# Patient Record
Sex: Female | Born: 1995 | Race: White | Hispanic: No | Marital: Married | Smoking: Never smoker
Health system: Southern US, Community
[De-identification: ages and names within clinical notes are randomized; demographics above are authoritative.]

## PROBLEM LIST (undated history)

## (undated) DIAGNOSIS — T8859XA Other complications of anesthesia, initial encounter: Secondary | ICD-10-CM

## (undated) DIAGNOSIS — J45909 Unspecified asthma, uncomplicated: Secondary | ICD-10-CM

---
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
## 2017-01-01 ENCOUNTER — Other Ambulatory Visit: Payer: Self-pay | Admitting: Specialist

## 2017-01-01 DIAGNOSIS — M5431 Sciatica, right side: Secondary | ICD-10-CM

## 2017-01-15 ENCOUNTER — Encounter: Payer: Self-pay | Admitting: Radiology

## 2017-01-15 ENCOUNTER — Ambulatory Visit
Admission: RE | Admit: 2017-01-15 | Discharge: 2017-01-15 | Disposition: A | Discharge status: Home / Self Care | Payer: 59 | Source: Ambulatory Visit | Attending: Specialist | Admitting: Specialist

## 2017-01-15 DIAGNOSIS — M5431 Sciatica, right side: Secondary | ICD-10-CM

## 2017-01-15 MED ORDER — IOPAMIDOL (ISOVUE-M 200) INJECTION 41%
1.0000 mL | Freq: Once | INTRAMUSCULAR | Status: AC
  Administered 2017-01-15: 1 mL via EPIDURAL

## 2017-01-15 MED ORDER — METHYLPREDNISOLONE ACETATE 40 MG/ML INJ SUSP (RADIOLOG
120.0000 mg | Freq: Once | INTRAMUSCULAR | Status: AC
  Administered 2017-01-15: 120 mg via EPIDURAL

## 2017-01-15 NOTE — Discharge Instructions (Signed)

## 2019-07-28 ENCOUNTER — Encounter (HOSPITAL_BASED_OUTPATIENT_CLINIC_OR_DEPARTMENT_OTHER): Payer: Self-pay | Admitting: *Deleted

## 2019-07-28 ENCOUNTER — Other Ambulatory Visit: Payer: Self-pay

## 2019-07-28 ENCOUNTER — Emergency Department (HOSPITAL_BASED_OUTPATIENT_CLINIC_OR_DEPARTMENT_OTHER): Payer: 59

## 2019-07-28 ENCOUNTER — Emergency Department (HOSPITAL_BASED_OUTPATIENT_CLINIC_OR_DEPARTMENT_OTHER)
Admission: EM | Admit: 2019-07-28 | Discharge: 2019-07-28 | Disposition: A | Discharge status: Home / Self Care | Payer: 59 | Attending: Emergency Medicine | Admitting: Emergency Medicine

## 2019-07-28 DIAGNOSIS — M62838 Other muscle spasm: Secondary | ICD-10-CM | POA: Insufficient documentation

## 2019-07-28 DIAGNOSIS — R0789 Other chest pain: Secondary | ICD-10-CM | POA: Insufficient documentation

## 2019-07-28 DIAGNOSIS — Z79899 Other long term (current) drug therapy: Secondary | ICD-10-CM | POA: Diagnosis not present

## 2019-07-28 DIAGNOSIS — R200 Anesthesia of skin: Secondary | ICD-10-CM | POA: Insufficient documentation

## 2019-07-28 DIAGNOSIS — R079 Chest pain, unspecified: Secondary | ICD-10-CM

## 2019-07-28 LAB — CBC
HCT: 45.3 % (ref 36.0–46.0)
Hemoglobin: 14.9 g/dL (ref 12.0–15.0)
MCH: 30.7 pg (ref 26.0–34.0)
MCHC: 32.9 g/dL (ref 30.0–36.0)
MCV: 93.2 fL (ref 80.0–100.0)
Platelets: 286 10*3/uL (ref 150–400)
RBC: 4.86 MIL/uL (ref 3.87–5.11)
RDW: 11.9 % (ref 11.5–15.5)
WBC: 7.2 10*3/uL (ref 4.0–10.5)
nRBC: 0 % (ref 0.0–0.2)

## 2019-07-28 LAB — BASIC METABOLIC PANEL
Anion gap: 11 (ref 5–15)
BUN: 13 mg/dL (ref 6–20)
CO2: 23 mmol/L (ref 22–32)
Calcium: 9.3 mg/dL (ref 8.9–10.3)
Chloride: 105 mmol/L (ref 98–111)
Creatinine, Ser: 0.75 mg/dL (ref 0.44–1.00)
GFR calc Af Amer: >60 mL/min (ref >60)
GFR calc non Af Amer: >60 mL/min (ref >60)
Glucose, Bld: 113 mg/dL — ABNORMAL HIGH (ref 70–99)
Potassium: 4 mmol/L (ref 3.5–5.1)
Sodium: 139 mmol/L (ref 135–145)

## 2019-07-28 LAB — TROPONIN I (HIGH SENSITIVITY)
Troponin I (High Sensitivity): <2 ng/L (ref <18)
Troponin I (High Sensitivity): <2 ng/L (ref <18)

## 2019-07-28 LAB — D-DIMER, QUANTITATIVE: D-Dimer, Quant: <0.27 ug/mL-FEU (ref 0.00–0.50)

## 2019-07-28 LAB — PREGNANCY, URINE: Preg Test, Ur: NEGATIVE

## 2019-07-28 LAB — TSH: TSH: 3.146 u[IU]/mL (ref 0.350–4.500)

## 2019-07-28 MED ORDER — METHOCARBAMOL 500 MG PO TABS: 500.0000 mg | ORAL_TABLET | Freq: Four times a day (QID) | ORAL | 0 refills | Status: DC

## 2019-07-28 NOTE — ED Triage Notes (Signed)
This am she had a sharp pain in the left side of her neck followed by tightness in her chest. the left side of her body feels weak and tingling. She drove herself here and is ambulatory. Hx of herniated disc 3 years ago.

## 2019-07-28 NOTE — Discharge Instructions (Addendum)
Follow up with your Physician for recheck.  Your thyroid test is pending.  Tylenol for pain Robaxin for possible spasm

## 2019-07-29 NOTE — ED Provider Notes (Signed)
[facility] EMERGENCY DEPARTMENT Provider Note   CSN: [id number] Arrival date & time: 07/28/19  1350     History Chief Complaint  Patient presents with  . Chest Pain  . Numbness    Sharon Lucas is a 24 y.o. female.  Pt reports she had a pain in the front of her neck that radiated down into her chest.  Pt reports pain in her left chest last for seconds and resolves.  The history is provided by the patient. No language interpreter was used.  Chest Pain Pain location:  L chest Pain quality: aching   Pain radiates to:  Neck Pain severity:  Moderate Onset quality:  Gradual Duration:  1 day Timing:  Constant Progression:  Waxing and waning Chronicity:  New Context: not breathing   Relieved by:  Nothing Worsened by:  Nothing Ineffective treatments:  None tried Associated symptoms: no fever, no nausea and no palpitations   Risk factors: no coronary artery disease and no high cholesterol        History reviewed. No pertinent past medical history.  Patient Active Problem List   Diagnosis Date Noted  . Irritable bowel syndrome 11/16/2013  . Mild intermittent asthma 08/01/2012    History reviewed. No pertinent surgical history.   OB History   No obstetric history on file.     No family history on file.  Social History   Tobacco Use  . Smoking status: Never Smoker  . Smokeless tobacco: Never Used  Substance Use Topics  . Alcohol use: Never  . Drug use: Never    Home Medications Prior to Admission medications   Medication Sig Start Date End Date Taking? Authorizing Provider  Norethin Ace-Eth Estrad-FE 1-20 MG-MCG(24) CAPS Take 1 tablet daily 08/04/16  Yes [provider]  albuterol (PROVENTIL HFA;VENTOLIN HFA) 108 (90 BASE) MCG/ACT inhaler Inhale 2 puffs into the lungs every 6 (six) hours as needed for wheezing or shortness of breath.    [provider]  methocarbamol (ROBAXIN) 500 MG tablet Take 1 tablet (500 mg total) by mouth 4  (four) times daily. 07/28/19   Elson Areas, PA-C    Allergies    Patient has no known allergies.  Review of Systems   Review of Systems  Constitutional: Negative for fever.  Cardiovascular: Positive for chest pain. Negative for palpitations.  Gastrointestinal: Negative for nausea.  All other systems reviewed and are negative.   Physical Exam Updated Vital Signs BP (!) 158/73 (BP Location: Right Arm)   Pulse (!) 105   Temp 98.5 F (36.9 C) (Oral)   Resp 20   Ht 5\' 4"  (1.626 m)   Wt 68 kg   SpO2 98%   BMI 25.75 kg/m   Physical Exam Vitals and nursing note reviewed.  Constitutional:      Appearance: She is well-developed.  HENT:     Head: Normocephalic.  Neck:     Comments: No bulging, good pulses,  Some tenderness sternocleidomastoid Cardiovascular:     Rate and Rhythm: Tachycardia present.     Heart sounds: Normal heart sounds.  Pulmonary:     Effort: Pulmonary effort is normal.     Breath sounds: Normal breath sounds.  Chest:     Chest wall: No deformity.  Abdominal:     General: There is no distension.     Palpations: Abdomen is soft.  Musculoskeletal:        General: Normal range of motion.     Cervical back: Normal range of  motion and neck supple.  Neurological:     Mental Status: She is alert and oriented to person, place, and time.  Psychiatric:        Mood and Affect: Mood normal.     ED Results / Procedures / Treatments   Labs (all labs ordered are listed, but only abnormal results are displayed) Labs Reviewed  BASIC METABOLIC PANEL - Abnormal; Notable for the following components:      Result Value   Glucose, Bld 113 (*)    All other components within normal limits  CBC  PREGNANCY, URINE  D-DIMER, QUANTITATIVE (NOT AT [facility])  TSH  TROPONIN I (HIGH SENSITIVITY)  TROPONIN I (HIGH SENSITIVITY)    EKG None  Radiology DG Chest Portable 1 View  Result Date: 07/28/2019 CLINICAL DATA:  Chest pain EXAM: PORTABLE CHEST 1 VIEW COMPARISON:   None. FINDINGS: The heart size and mediastinal contours are within normal limits. Both lungs are clear. The visualized skeletal structures are unremarkable. IMPRESSION: No active disease. Electronically Signed   By: Davina Poke D.O.   On: 07/28/2019 15:07    Procedures Procedures (including critical care time)  Medications Ordered in ED Medications - No data to display  ED Course  I have reviewed the triage vital signs and the nursing notes.  Pertinent labs & imaging results that were available during my care of the patient were reviewed by me and considered in my medical decision making (see chart for details).    MDM Rules/Calculators/A&P                      MDM:  Labs are reviewed and normal  Negative troponin, normal tsh and ddimer.  ECG is nonacute, chest xray is normal I suspect pain is muscular,  MDM Number of Diagnoses or Management Options Atypical chest pain: new, needed workup Muscle spasms of neck: new, needed workup Nonspecific chest pain: new, needed workup   Amount and/or Complexity of Data Reviewed Clinical lab tests: reviewed Review and summarize past medical records: yes Discuss the patient with other providers: yes Independent visualization of images, tracings, or specimens: yes  Risk of Complications, Morbidity, and/or Mortality Presenting problems: moderate Diagnostic procedures: moderate Management options: moderate   Final Clinical Impression(s) / ED Diagnoses Final diagnoses:  Nonspecific chest pain  Atypical chest pain  Muscle spasms of neck    Rx / DC Orders ED Discharge Orders         Ordered    methocarbamol (ROBAXIN) 500 MG tablet  4 times daily,   Status:  Discontinued     07/28/19 1858    methocarbamol (ROBAXIN) 500 MG tablet  4 times daily     07/28/19 1900        An After Visit Summary was printed and given to the patient.    Fransico Meadow, Hershal Coria 07/29/19 0908    Truddie Hidden, MD 07/30/19 [phone]

## 2020-04-13 HISTORY — PX: ANTERIOR CRUCIATE LIGAMENT REPAIR: SHX115

## 2020-11-04 DIAGNOSIS — S83512A Sprain of anterior cruciate ligament of left knee, initial encounter: Secondary | ICD-10-CM | POA: Diagnosis not present

## 2020-11-04 DIAGNOSIS — M2392 Unspecified internal derangement of left knee: Secondary | ICD-10-CM | POA: Diagnosis not present

## 2020-11-04 DIAGNOSIS — S83242A Other tear of medial meniscus, current injury, left knee, initial encounter: Secondary | ICD-10-CM | POA: Diagnosis not present

## 2020-11-04 DIAGNOSIS — S8992XA Unspecified injury of left lower leg, initial encounter: Secondary | ICD-10-CM | POA: Diagnosis not present

## 2020-11-05 DIAGNOSIS — M25562 Pain in left knee: Secondary | ICD-10-CM | POA: Diagnosis not present

## 2020-11-11 DIAGNOSIS — M25562 Pain in left knee: Secondary | ICD-10-CM | POA: Diagnosis not present

## 2020-12-19 DIAGNOSIS — S83502A Sprain of unspecified cruciate ligament of left knee, initial encounter: Secondary | ICD-10-CM | POA: Diagnosis not present

## 2020-12-26 ENCOUNTER — Other Ambulatory Visit: Payer: Self-pay | Admitting: Orthopedic Surgery

## 2020-12-26 ENCOUNTER — Ambulatory Visit
Admission: RE | Admit: 2020-12-26 | Discharge: 2020-12-26 | Disposition: A | Discharge status: Home / Self Care | Payer: 59 | Source: Ambulatory Visit | Attending: Orthopedic Surgery | Admitting: Orthopedic Surgery

## 2020-12-26 ENCOUNTER — Other Ambulatory Visit: Payer: Self-pay

## 2020-12-26 ENCOUNTER — Ambulatory Visit
Admission: RE | Admit: 2020-12-26 | Discharge: 2020-12-26 | Disposition: A | Discharge status: Home / Self Care | Payer: Worker's Compensation | Source: Ambulatory Visit | Attending: Orthopedic Surgery | Admitting: Orthopedic Surgery

## 2020-12-26 DIAGNOSIS — K760 Fatty (change of) liver, not elsewhere classified: Secondary | ICD-10-CM | POA: Diagnosis not present

## 2020-12-26 DIAGNOSIS — R0602 Shortness of breath: Secondary | ICD-10-CM

## 2020-12-26 DIAGNOSIS — M7989 Other specified soft tissue disorders: Secondary | ICD-10-CM

## 2020-12-26 DIAGNOSIS — R079 Chest pain, unspecified: Secondary | ICD-10-CM

## 2020-12-26 DIAGNOSIS — M79605 Pain in left leg: Secondary | ICD-10-CM | POA: Diagnosis not present

## 2020-12-26 DIAGNOSIS — M79662 Pain in left lower leg: Secondary | ICD-10-CM

## 2020-12-26 MED ORDER — IOPAMIDOL (ISOVUE-370) INJECTION 76%
75.0000 mL | Freq: Once | INTRAVENOUS | Status: AC | PRN
  Administered 2020-12-26: 75 mL via INTRAVENOUS

## 2020-12-30 DIAGNOSIS — Z0000 Encounter for general adult medical examination without abnormal findings: Secondary | ICD-10-CM | POA: Diagnosis not present

## 2020-12-30 DIAGNOSIS — Z13220 Encounter for screening for lipoid disorders: Secondary | ICD-10-CM | POA: Diagnosis not present

## 2020-12-30 DIAGNOSIS — Z23 Encounter for immunization: Secondary | ICD-10-CM | POA: Diagnosis not present

## 2020-12-30 DIAGNOSIS — R42 Dizziness and giddiness: Secondary | ICD-10-CM | POA: Diagnosis not present

## 2020-12-30 DIAGNOSIS — L659 Nonscarring hair loss, unspecified: Secondary | ICD-10-CM | POA: Diagnosis not present

## 2021-02-25 DIAGNOSIS — H5213 Myopia, bilateral: Secondary | ICD-10-CM | POA: Diagnosis not present

## 2021-05-08 ENCOUNTER — Encounter (HOSPITAL_BASED_OUTPATIENT_CLINIC_OR_DEPARTMENT_OTHER): Payer: Self-pay | Admitting: Orthopedic Surgery

## 2021-05-08 ENCOUNTER — Other Ambulatory Visit: Payer: Self-pay

## 2021-05-14 NOTE — H&P (Addendum)
PREOPERATIVE H&P  Chief Complaint: painful left knee popping and catching  HPI: Sharon Lucas is a 26 y.o. female who presents for preoperative history and physical evaluation for a left knee s/p ACL hamstring autograft reconstruction and partial lateral menisectomy with persistent painful popping and catching in the knee. She has been in physical therapy, but it has been difficult to do rehab secondary to this painful clunk in her knee. She underwent a [employer] evaluation that showed very poor results because of this pain. She has tried intraarticular injection which did not help her pain. She has also been on Celebrex. 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 This is significantly impairing activities of daily living.  She has elected for surgical management.   Past Medical History:  Diagnosis Date   Complication of anesthesia    post ACL surgery had SOB, CP, dizzienee, nausea, vertigo--was negative for DVT   Past Surgical History:  Procedure Laterality Date   ANTERIOR CRUCIATE LIGAMENT REPAIR Left 2022   surgical center [place]   Social History   Socioeconomic History   Marital status: Single    Spouse name: Not on file   Number of children: Not on file   Years of education: Not on file   Highest education level: Not on file  Occupational History   Not on file  Tobacco Use   Smoking status: Never   Smokeless tobacco: Never  Vaping Use   Vaping Use: Never used  Substance and Sexual Activity   Alcohol use: Never   Drug use: Never   Sexual activity: Not on file  Other Topics Concern   Not on file  Social History Narrative   Not on file   Social Determinants of Health   Financial Resource Strain: Not on file  Food Insecurity: Not on file  Transportation Needs: Not on file  Physical Activity: Not on file  Stress: Not on file  Social Connections: Not on file   No family history on file. No Known Allergies Prior to  Admission medications   Medication Sig Start Date End Date Taking? Authorizing Provider  Multiple Vitamin (MULTIVITAMIN ADULT PO) Take by mouth.   Yes [provider]  Norethin Ace-Eth Estrad-FE 1-20 MG-MCG(24) CAPS Take 1 tablet daily 08/04/16  Yes [provider]  psyllium (METAMUCIL) 58.6 % powder Take 1 packet by mouth 3 (three) times daily.   Yes [provider]  albuterol (PROVENTIL HFA;VENTOLIN HFA) 108 (90 BASE) MCG/ACT inhaler Inhale 2 puffs into the lungs every 6 (six) hours as needed for wheezing or shortness of breath.    [provider]  methocarbamol (ROBAXIN) 500 MG tablet Take 1 tablet (500 mg total) by mouth 4 (four) times daily. 07/28/19   Elson Areas, PA-C     Positive ROS: All other systems have been reviewed and were otherwise negative with the exception of those mentioned in the HPI and as above.  Physical Exam: General: Alert, no acute distress Cardiovascular: No pedal edema Respiratory: No cyanosis, no use of accessory musculature GI: No organomegaly, abdomen is soft and non-tender Skin: No lesions in the area of chief complaint Neurologic: Sensation intact distally Psychiatric: Patient is competent for consent with normal mood and affect Lymphatic: No axillary or cervical lymphadenopathy  MUSCULOSKELETAL: On examination of the left knee reveals popping through a 40-60 degree range of motion with a painful clunk and pop. The range of motion is from 0-120 degrees. The knee is stable.  Incisions are healing well.   Assessment: Post operative left knee arthrofibrosis and synovitis   Plan: Plan for Procedure(s): ARTHROSCOPY KNEE LYSIS OF ADHESION EXAM UNDER ANESTHESIA WITH MANIPULATION OF KNEE  The risks benefits and alternatives were discussed with the patient including but not limited to the risks of nonoperative treatment, versus surgical intervention including infection, bleeding, nerve injury,  blood clots,  cardiopulmonary complications, morbidity, mortality, among others, and they were willing to proceed.   Armida Sans, PA-C    05/14/2021 4:13 PM

## 2021-05-16 NOTE — Progress Notes (Signed)

## 2021-05-18 NOTE — Anesthesia Preprocedure Evaluation (Addendum)
Anesthesia Evaluation  Patient identified by MRN, date of birth, ID band Patient awake    Reviewed: Allergy & Precautions, NPO status , Patient's Chart, lab work & pertinent test results  History of Anesthesia Complications Negative for: history of anesthetic complications  Airway Mallampati: I  TM Distance: >3 FB Neck ROM: Full    Dental no notable dental hx. (+) Dental Advisory Given   Pulmonary asthma ,    Pulmonary exam normal        Cardiovascular negative cardio ROS Normal cardiovascular exam     Neuro/Psych negative neurological ROS     GI/Hepatic negative GI ROS, Neg liver ROS,   Endo/Other  negative endocrine ROS  Renal/GU negative Renal ROS     Musculoskeletal negative musculoskeletal ROS (+)   Abdominal   Peds  Hematology negative hematology ROS (+)   Anesthesia Other Findings   Reproductive/Obstetrics                            Anesthesia Physical Anesthesia Plan  ASA: 2  Anesthesia Plan: General   Post-op Pain Management: Tylenol PO (pre-op) and Celebrex PO (pre-op)   Induction:   PONV Risk Score and Plan: 3 and Ondansetron, Dexamethasone, Midazolam and Scopolamine patch - Pre-op  Airway Management Planned: LMA  Additional Equipment:   Intra-op Plan:   Post-operative Plan: Extubation in OR  Informed Consent: I have reviewed the patients History and Physical, chart, labs and discussed the procedure including the risks, benefits and alternatives for the proposed anesthesia with the patient or authorized representative who has indicated his/her understanding and acceptance.     Dental advisory given  Plan Discussed with: Anesthesiologist and CRNA  Anesthesia Plan Comments:        Anesthesia Quick Evaluation

## 2021-05-19 ENCOUNTER — Ambulatory Visit (HOSPITAL_BASED_OUTPATIENT_CLINIC_OR_DEPARTMENT_OTHER): Payer: No Typology Code available for payment source | Admitting: Certified Registered"

## 2021-05-19 ENCOUNTER — Encounter (HOSPITAL_BASED_OUTPATIENT_CLINIC_OR_DEPARTMENT_OTHER): Payer: Self-pay | Admitting: Orthopedic Surgery

## 2021-05-19 ENCOUNTER — Encounter (HOSPITAL_BASED_OUTPATIENT_CLINIC_OR_DEPARTMENT_OTHER)
Admission: RE | Disposition: A | Discharge status: Home / Self Care | Payer: Self-pay | Source: Ambulatory Visit | Attending: Orthopedic Surgery

## 2021-05-19 ENCOUNTER — Ambulatory Visit (HOSPITAL_BASED_OUTPATIENT_CLINIC_OR_DEPARTMENT_OTHER)
Admission: RE | Admit: 2021-05-19 | Discharge: 2021-05-19 | Disposition: A | Discharge status: Home / Self Care | Payer: No Typology Code available for payment source | Source: Ambulatory Visit | Attending: Orthopedic Surgery | Admitting: Orthopedic Surgery

## 2021-05-19 ENCOUNTER — Other Ambulatory Visit (HOSPITAL_BASED_OUTPATIENT_CLINIC_OR_DEPARTMENT_OTHER): Payer: Self-pay

## 2021-05-19 ENCOUNTER — Other Ambulatory Visit: Payer: Self-pay

## 2021-05-19 DIAGNOSIS — Z9889 Other specified postprocedural states: Secondary | ICD-10-CM | POA: Diagnosis not present

## 2021-05-19 DIAGNOSIS — M238X2 Other internal derangements of left knee: Secondary | ICD-10-CM | POA: Diagnosis not present

## 2021-05-19 DIAGNOSIS — M659 Synovitis and tenosynovitis, unspecified: Secondary | ICD-10-CM | POA: Diagnosis not present

## 2021-05-19 DIAGNOSIS — J45909 Unspecified asthma, uncomplicated: Secondary | ICD-10-CM | POA: Diagnosis not present

## 2021-05-19 DIAGNOSIS — G8918 Other acute postprocedural pain: Secondary | ICD-10-CM | POA: Diagnosis not present

## 2021-05-19 DIAGNOSIS — M24662 Ankylosis, left knee: Secondary | ICD-10-CM | POA: Insufficient documentation

## 2021-05-19 HISTORY — PX: EXAM UNDER ANESTHESIA WITH MANIPULATION OF KNEE: SHX5816

## 2021-05-19 HISTORY — PX: KNEE ARTHROSCOPY: SHX127

## 2021-05-19 HISTORY — DX: Unspecified asthma, uncomplicated: J45.909

## 2021-05-19 HISTORY — PX: LYSIS OF ADHESION: SHX5961

## 2021-05-19 HISTORY — DX: Other complications of anesthesia, initial encounter: T88.59XA

## 2021-05-19 LAB — POCT PREGNANCY, URINE: Preg Test, Ur: NEGATIVE

## 2021-05-19 SURGERY — ARTHROSCOPY, KNEE
Anesthesia: General | Site: Knee | Laterality: Left

## 2021-05-19 MED ORDER — FENTANYL CITRATE (PF) 100 MCG/2ML IJ SOLN
INTRAMUSCULAR | Status: AC
  Filled 2021-05-19: qty 2

## 2021-05-19 MED ORDER — ONDANSETRON HCL 4 MG/2ML IJ SOLN
INTRAMUSCULAR | Status: AC
  Filled 2021-05-19: qty 2

## 2021-05-19 MED ORDER — SCOPOLAMINE 1 MG/3DAYS TD PT72: 1.0000 | MEDICATED_PATCH | TRANSDERMAL | Status: DC

## 2021-05-19 MED ORDER — FENTANYL CITRATE (PF) 100 MCG/2ML IJ SOLN: 25.0000 ug | INTRAMUSCULAR | Status: DC | PRN

## 2021-05-19 MED ORDER — SODIUM CHLORIDE 0.9 % IR SOLN
Status: DC | PRN
  Administered 2021-05-19: 3000 mL

## 2021-05-19 MED ORDER — METHYLPREDNISOLONE ACETATE 80 MG/ML IJ SUSP
INTRAMUSCULAR | Status: AC
  Filled 2021-05-19: qty 1

## 2021-05-19 MED ORDER — CELECOXIB 200 MG PO CAPS
ORAL_CAPSULE | ORAL | Status: AC
  Filled 2021-05-19: qty 1

## 2021-05-19 MED ORDER — AMISULPRIDE (ANTIEMETIC) 5 MG/2ML IV SOLN: 10.0000 mg | Freq: Once | INTRAVENOUS | Status: DC | PRN

## 2021-05-19 MED ORDER — LIDOCAINE HCL (CARDIAC) PF 100 MG/5ML IV SOSY
PREFILLED_SYRINGE | INTRAVENOUS | Status: DC | PRN
  Administered 2021-05-19: 80 mg via INTRAVENOUS

## 2021-05-19 MED ORDER — CEFAZOLIN SODIUM-DEXTROSE 2-4 GM/100ML-% IV SOLN
2.0000 g | INTRAVENOUS | Status: AC
  Administered 2021-05-19: 2 g via INTRAVENOUS

## 2021-05-19 MED ORDER — ACETAMINOPHEN 500 MG PO TABS: 1000.0000 mg | ORAL_TABLET | Freq: Once | ORAL | Status: AC

## 2021-05-19 MED ORDER — ACETAMINOPHEN 500 MG PO TABS
ORAL_TABLET | ORAL | Status: AC
  Filled 2021-05-19: qty 2

## 2021-05-19 MED ORDER — MIDAZOLAM HCL 2 MG/2ML IJ SOLN
INTRAMUSCULAR | Status: AC
  Filled 2021-05-19: qty 2

## 2021-05-19 MED ORDER — CEFAZOLIN SODIUM-DEXTROSE 2-4 GM/100ML-% IV SOLN
INTRAVENOUS | Status: AC
  Filled 2021-05-19: qty 100

## 2021-05-19 MED ORDER — BUPIVACAINE HCL (PF) 0.25 % IJ SOLN
INTRAMUSCULAR | Status: AC
  Filled 2021-05-19: qty 30

## 2021-05-19 MED ORDER — FENTANYL CITRATE (PF) 100 MCG/2ML IJ SOLN
100.0000 ug | Freq: Once | INTRAMUSCULAR | Status: AC
  Administered 2021-05-19: 100 ug via INTRAVENOUS

## 2021-05-19 MED ORDER — FENTANYL CITRATE (PF) 100 MCG/2ML IJ SOLN
INTRAMUSCULAR | Status: DC | PRN
  Administered 2021-05-19: 50 ug via INTRAVENOUS

## 2021-05-19 MED ORDER — LACTATED RINGERS IV SOLN: INTRAVENOUS | Status: DC

## 2021-05-19 MED ORDER — METHYLPREDNISOLONE ACETATE 40 MG/ML IJ SUSP
INTRAMUSCULAR | Status: AC
  Filled 2021-05-19: qty 1

## 2021-05-19 MED ORDER — SCOPOLAMINE 1 MG/3DAYS TD PT72
1.0000 | MEDICATED_PATCH | TRANSDERMAL | Status: DC
  Administered 2021-05-19: 1.5 mg via TRANSDERMAL

## 2021-05-19 MED ORDER — PROMETHAZINE HCL 25 MG/ML IJ SOLN: 6.2500 mg | INTRAMUSCULAR | Status: DC | PRN

## 2021-05-19 MED ORDER — ONDANSETRON HCL 4 MG/2ML IJ SOLN
INTRAMUSCULAR | Status: DC | PRN
Start: 2021-05-19 — End: 2021-05-19
  Administered 2021-05-19: 4 mg via INTRAVENOUS

## 2021-05-19 MED ORDER — PROPOFOL 10 MG/ML IV BOLUS
INTRAVENOUS | Status: DC | PRN
  Administered 2021-05-19: 200 mg via INTRAVENOUS

## 2021-05-19 MED ORDER — BUPIVACAINE-EPINEPHRINE (PF) 0.5% -1:200000 IJ SOLN
INTRAMUSCULAR | Status: DC | PRN
  Administered 2021-05-19: 20 mL

## 2021-05-19 MED ORDER — CELECOXIB 200 MG PO CAPS
200.0000 mg | ORAL_CAPSULE | Freq: Once | ORAL | Status: AC
  Administered 2021-05-19: 200 mg via ORAL

## 2021-05-19 MED ORDER — MIDAZOLAM HCL 2 MG/2ML IJ SOLN
2.0000 mg | Freq: Once | INTRAMUSCULAR | Status: AC
  Administered 2021-05-19: 2 mg via INTRAVENOUS

## 2021-05-19 MED ORDER — ACETAMINOPHEN 500 MG PO TABS
1000.0000 mg | ORAL_TABLET | Freq: Once | ORAL | Status: AC
  Administered 2021-05-19: 1000 mg via ORAL

## 2021-05-19 MED ORDER — HYDROCODONE-ACETAMINOPHEN 5-325 MG PO TABS
1.0000 | ORAL_TABLET | Freq: Four times a day (QID) | ORAL | 0 refills | Status: AC | PRN
  Filled 2021-05-19: qty 10, 3d supply, fill #0

## 2021-05-19 MED ORDER — POVIDONE-IODINE 7.5 % EX SOLN: Freq: Once | CUTANEOUS | Status: DC

## 2021-05-19 MED ORDER — MIDAZOLAM HCL 5 MG/5ML IJ SOLN
INTRAMUSCULAR | Status: DC | PRN
  Administered 2021-05-19: 1 mg via INTRAVENOUS

## 2021-05-19 MED ORDER — SCOPOLAMINE 1 MG/3DAYS TD PT72
MEDICATED_PATCH | TRANSDERMAL | Status: AC
  Filled 2021-05-19: qty 1

## 2021-05-19 MED ORDER — DEXAMETHASONE SODIUM PHOSPHATE 4 MG/ML IJ SOLN
INTRAMUSCULAR | Status: DC | PRN
  Administered 2021-05-19: 10 mg via INTRAVENOUS

## 2021-05-19 SURGICAL SUPPLY — 29 items
BLADE SURG 15 STRL LF DISP TIS (BLADE) IMPLANT
BLADE SURG 15 STRL SS (BLADE) ×2
BNDG ELASTIC 6X5.8 VLCR STR LF (GAUZE/BANDAGES/DRESSINGS) ×2 IMPLANT
DISSECTOR  3.8MM X 13CM (MISCELLANEOUS) ×2
DISSECTOR 3.8MM X 13CM (MISCELLANEOUS) IMPLANT
DRAPE ARTHROSCOPY W/POUCH 90 (DRAPES) ×2 IMPLANT
DURAPREP 26ML APPLICATOR (WOUND CARE) ×2 IMPLANT
EXCALIBUR 3.8MM X 13CM (MISCELLANEOUS) ×1 IMPLANT
GAUZE SPONGE 4X4 12PLY STRL (GAUZE/BANDAGES/DRESSINGS) ×2 IMPLANT
GAUZE XEROFORM 1X8 LF (GAUZE/BANDAGES/DRESSINGS) ×2 IMPLANT
GLOVE SURG MICRO LTX SZ7.5 (GLOVE) ×2 IMPLANT
GLOVE SURG UNDER POLY LF SZ7.5 (GLOVE) ×2 IMPLANT
GOWN STRL REUS W/ TWL LRG LVL3 (GOWN DISPOSABLE) ×2 IMPLANT
GOWN STRL REUS W/ TWL XL LVL3 (GOWN DISPOSABLE) ×1 IMPLANT
GOWN STRL REUS W/TWL LRG LVL3 (GOWN DISPOSABLE) ×2
GOWN STRL REUS W/TWL XL LVL3 (GOWN DISPOSABLE) ×2
HOLDER KNEE FOAM BLUE (MISCELLANEOUS) ×2 IMPLANT
MANIFOLD NEPTUNE II (INSTRUMENTS) ×2 IMPLANT
NDL SAFETY ECLIPSE 18X1.5 (NEEDLE) ×2 IMPLANT
NEEDLE HYPO 18GX1.5 SHARP (NEEDLE) ×4
NEEDLE HYPO 22GX1.5 SAFETY (NEEDLE) ×1 IMPLANT
PACK ARTHROSCOPY DSU (CUSTOM PROCEDURE TRAY) ×2 IMPLANT
PACK BASIN DAY SURGERY FS (CUSTOM PROCEDURE TRAY) ×2 IMPLANT
PORT APPOLLO RF 90DEGREE MULTI (SURGICAL WAND) ×1 IMPLANT
SUT ETHILON 4 0 PS 2 18 (SUTURE) ×2 IMPLANT
SYR 5ML LL (SYRINGE) ×2 IMPLANT
TOWEL GREEN STERILE FF (TOWEL DISPOSABLE) ×2 IMPLANT
TUBING ARTHROSCOPY IRRIG 16FT (MISCELLANEOUS) ×2 IMPLANT
WRAP KNEE MAXI GEL POST OP (GAUZE/BANDAGES/DRESSINGS) ×2 IMPLANT

## 2021-05-19 NOTE — Discharge Instructions (Addendum)
Diet: As you were doing prior to hospitalization   Shower:  May shower but keep the wounds dry, use an occlusive plastic wrap, NO SOAKING IN TUB.  If the bandage gets wet, change with a clean dry gauze.   Dressing:  You may change your dressing 3-5 days after surgery  Activity:  Increase activity slowly as tolerated, but follow the weight bearing instructions below.  The rules on driving is that you can not be taking narcotics while you drive, and you must feel in control of the vehicle.    Weight Bearing:  Weight bearing as tolerated.   To prevent constipation: you may use a stool softener such as -  Colace (over the counter) 100 mg by mouth twice a day  Drink plenty of fluids (prune juice may be helpful) and high fiber foods Miralax (over the counter) for constipation as needed.    Itching:  If you experience itching with your medications, try taking only a single pain pill, or even half a pain pill at a time.  You may take up to 10 pain pills per day, and you can also use benadryl over the counter for itching or also to help with sleep.   Precautions:  If you experience chest pain or shortness of breath - call 911 immediately for transfer to the hospital emergency department!!  If you develop a fever greater that 101 F, purulent drainage from wound, increased redness or drainage from wound, or calf pain -- Call the office at [phone]                                                Follow- Up Appointment:  Please call for an appointment to be seen postoperatively [facility] - [phone]  We will email you post-operative exercise program to work on at home.   *You had 1000 mg of Tylenol at 10:15 am *No ibuprofen/motrin/advil until after 4:20 pm  Post Anesthesia Home Care Instructions  Activity: Get plenty of rest for the remainder of the day. A responsible individual must stay with you for 24 hours following the procedure.  For the next 24 hours, DO NOT: -Drive a car -Social worker -Drink alcoholic beverages -Take any medication unless instructed by your physician -Make any legal decisions or sign important papers.  Meals: Start with liquid foods such as gelatin or soup. Progress to regular foods as tolerated. Avoid greasy, spicy, heavy foods. If nausea and/or vomiting occur, drink only clear liquids until the nausea and/or vomiting subsides. Call your physician if vomiting continues.  Special Instructions/Symptoms: Your throat may feel dry or sore from the anesthesia or the breathing tube placed in your throat during surgery. If this causes discomfort, gargle with warm salt water. The discomfort should disappear within 24 hours.  If you had a scopolamine patch placed behind your ear for the management of post- operative nausea and/or vomiting:  1. The medication in the patch is effective for 72 hours, after which it should be removed.  Wrap patch in a tissue and discard in the trash. Wash hands thoroughly with soap and water. 2. You may remove the patch earlier than 72 hours if you experience unpleasant side effects which may include dry mouth, dizziness or visual disturbances. 3. Avoid touching the patch. Wash your hands with soap and water after contact with the patch.  Regional Anesthesia Blocks  1. Numbness or the inability to move the "blocked" extremity may last from 3-48 hours after placement. The length of time depends on the medication injected and your individual response to the medication. If the numbness is not going away after 48 hours, call your surgeon.  2. The extremity that is blocked will need to be protected until the numbness is gone and the  Strength has returned. Because you cannot feel it, you will need to take extra care to avoid injury. Because it may be weak, you may have difficulty moving it or using it. You may not know what position it is in without looking at it while the block is in effect.  3. For blocks in the legs and  feet, returning to weight bearing and walking needs to be done carefully. You will need to wait until the numbness is entirely gone and the strength has returned. You should be able to move your leg and foot normally before you try and bear weight or walk. You will need someone to be with you when you first try to ensure you do not fall and possibly risk injury.  4. Bruising and tenderness at the needle site are common side effects and will resolve in a few days.  5. Persistent numbness or new problems with movement should be communicated to the surgeon or the [facility] [phone] [facility] [phone]).

## 2021-05-19 NOTE — Progress Notes (Signed)
Assisted Dr. Tobias Alexander with left, ultrasound guided, knee block. Side rails up, monitors on throughout procedure. See vital signs in flow sheet. Tolerated Procedure well.

## 2021-05-19 NOTE — Anesthesia Postprocedure Evaluation (Signed)
Anesthesia Post Note  Patient: Sharon Lucas  Procedure(s) Performed: ARTHROSCOPY KNEE (Left: Knee) LYSIS OF ADHESION (Left: Knee) EXAM UNDER ANESTHESIA WITH MANIPULATION OF KNEE (Left: Knee)     Patient location during evaluation: PACU Anesthesia Type: General Level of consciousness: sedated Pain management: pain level controlled Vital Signs Assessment: post-procedure vital signs reviewed and stable Respiratory status: spontaneous breathing and respiratory function stable Cardiovascular status: stable Postop Assessment: no apparent nausea or vomiting Anesthetic complications: no   No notable events documented.  Last Vitals:  Vitals:   05/19/21 1330 05/19/21 1356  BP: 114/76 (!) 137/91  Pulse: (!) 121 (!) 119  Resp: 20 18  Temp:  36.7 C  SpO2: 96% 97%    Last Pain:  Vitals:   05/19/21 1342  TempSrc:   PainSc: 0-No pain                 Daimion Adamcik DANIEL

## 2021-05-19 NOTE — Op Note (Signed)
[name], SHATZ MEDICAL RECORD NO: [id number] ACCOUNT NO: [id number] DATE OF BIRTH: 05-23-1995 FACILITY: [facility] LOCATION: [facility] PHYSICIAN: Elana Alm. Thurston Hole, MD  Operative Report   DATE OF PROCEDURE: 05/19/2021  PREOPERATIVE DIAGNOSIS:  Left knee adhesions/synovitis, status post ACL reconstruction.  POSTOPERATIVE DIAGNOSIS:  Left knee adhesions/synovitis, status post ACL reconstruction.  PROCEDURE:  Left knee EUA followed by arthroscopic lysis of adhesions and synovectomy.  SURGEON:  Elana Alm. Thurston Hole, MD  ANESTHESIA:  General.  OPERATIVE TIME:  30 minutes.  COMPLICATIONS:  None.  INDICATIONS FOR PROCEDURE:  The patient is a 26 year old who had undergone left knee ACL reconstruction 5 months ago.  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  DESCRIPTION OF PROCEDURE:  The patient was brought to the operating room on 05/19/2021 after a knee block had been placed in the holding room by Anesthesia.  She was placed on the operating table in a supine position.  After being placed under general  anesthesia, her left knee was examined.  She had full range of motion, knee with stable ligamentous exam with normal patellar tracking.  Left leg was prepped in usual sterile DuraPrep and draped using sterile technique.  Timeout procedure was called and  the correct left knee identified. Initially through an anterolateral portal, the arthroscope with a pump attached was placed and through an anteromedial portal, an arthroscopic probe was placed.  On initial inspection of the medial compartment, the  articular cartilage was intact.  Medial meniscus was intact.  There was thickened medial bands of synovitis and adhesions, which were thoroughly debrided.  Intercondylar  notch was evaluated.  There was a significant cyclops lesion anterior to the ACL  graft, which was thoroughly debrided and removed.  The ACL graft was intact and stable.  PCL was intact and stable.  Lateral compartment articular cartilage was normal.  Lateral meniscus was normal.  There were more thick adhesions and synovitis in the  anterolateral aspect of the knee that were thoroughly debrided and cauterized as well.  The patellofemoral joint was inspected.  The articular cartilage was normal.  The patella tracked normally, but there were more thick bands of adhesions and synovitis  in the anteromedial and anterolateral gutters and these were thoroughly debrided and cauterized as well.  At this point, I felt that all pathology had been satisfactorily addressed.  The instruments were removed.  Portals were closed with 3-0 nylon  suture.  Sterile dressings were applied and the patient was awakened and taken to recovery room in stable condition.  FOLLOWUP CARE:  The patient will be followed as an outpatient on Norco for pain with physical therapy.  See me back in office in a week for sutures out and follow up.   VAI D: 05/19/2021 1:37:48 pm T: 05/19/2021 11:05:00 pm  JOB: [id number]/ [id number]

## 2021-05-19 NOTE — Anesthesia Procedure Notes (Signed)
Anesthesia Regional Block: Knee block   Pre-Anesthetic Checklist: , timeout performed,  Correct Patient, Correct Site, Correct Laterality,  Correct Procedure, Correct Position, site marked,  Risks and benefits discussed,  Surgical consent,  Pre-op evaluation,  At surgeon's request and post-op pain management  Laterality: Left  Prep: Maximum Sterile Barrier Precautions used, chloraprep       Needles:  Injection technique: Single-shot      Needle Length: 2cm  Needle Gauge: 18     Additional Needles:   Procedures:,,,,,, #20gu IV placed    Narrative:  Start time: 05/19/2021 11:28 AM End time: 05/19/2021 11:35 AM Injection made incrementally with aspirations every 5 mL.  Performed by: Personally  Anesthesiologist: Heather Roberts, MD

## 2021-05-19 NOTE — Interval H&P Note (Signed)
History and Physical Interval Note:  05/19/2021 11:47 AM  Sharon Lucas  has presented today for surgery, with the diagnosis of left knee ankylosis.  The various methods of treatment have been discussed with the patient and family. After consideration of risks, benefits and other options for treatment, the patient has consented to  Procedure(s): ARTHROSCOPY KNEE (Left) LYSIS OF ADHESION (Left) EXAM UNDER ANESTHESIA WITH MANIPULATION OF KNEE (Left) as a surgical intervention.  The patient's history has been reviewed, patient examined, no change in status, stable for surgery.  I have reviewed the patient's chart and labs.  Questions were answered to the patient's satisfaction.     Nilda Simmer

## 2021-05-19 NOTE — Transfer of Care (Signed)
Immediate Anesthesia Transfer of Care Note  Patient: Sharon Lucas  Procedure(s) Performed: ARTHROSCOPY KNEE (Left: Knee) LYSIS OF ADHESION (Left: Knee) EXAM UNDER ANESTHESIA WITH MANIPULATION OF KNEE (Left: Knee)  Patient Location: PACU  Anesthesia Type:General  Level of Consciousness: sedated  Airway & Oxygen Therapy: Patient Spontanous Breathing and Patient connected to face mask oxygen  Post-op Assessment: Report given to RN and Post -op Vital signs reviewed and stable  Post vital signs: Reviewed and stable  Last Vitals:  Vitals Value Taken Time  BP 131/80 05/19/21 1257  Temp    Pulse 137 05/19/21 1257  Resp 24 05/19/21 1257  SpO2 100 % 05/19/21 1257  Vitals shown include unvalidated device data.  Last Pain:  Vitals:   05/19/21 1015  TempSrc: Oral  PainSc: 0-No pain         Complications: No notable events documented.

## 2021-05-19 NOTE — Anesthesia Procedure Notes (Signed)
Procedure Name: LMA Insertion Date/Time: 05/19/2021 12:08 PM Performed by: Tawni Millers, CRNA Pre-anesthesia Checklist: Patient identified, Emergency Drugs available, Suction available and Patient being monitored Patient Re-evaluated:Patient Re-evaluated prior to induction Oxygen Delivery Method: Circle system utilized Preoxygenation: Pre-oxygenation with 100% oxygen Induction Type: IV induction Ventilation: Mask ventilation without difficulty LMA: LMA inserted LMA Size: 3.0 Number of attempts: 1 Airway Equipment and Method: Bite block Placement Confirmation: positive ETCO2 Tube secured with: Tape Dental Injury: Teeth and Oropharynx as per pre-operative assessment

## 2021-05-20 ENCOUNTER — Encounter (HOSPITAL_BASED_OUTPATIENT_CLINIC_OR_DEPARTMENT_OTHER): Payer: Self-pay | Admitting: Orthopedic Surgery

## 2021-05-21 NOTE — Addendum Note (Signed)
Addendum  created 05/21/21 [id number] by Logan Baltimore, Jewel Baize, CRNA   Charge Capture section accepted

## 2021-05-26 ENCOUNTER — Other Ambulatory Visit (HOSPITAL_BASED_OUTPATIENT_CLINIC_OR_DEPARTMENT_OTHER): Payer: Self-pay

## 2022-01-05 DIAGNOSIS — Z23 Encounter for immunization: Secondary | ICD-10-CM | POA: Diagnosis not present

## 2022-01-05 DIAGNOSIS — E559 Vitamin D deficiency, unspecified: Secondary | ICD-10-CM | POA: Diagnosis not present

## 2022-01-05 DIAGNOSIS — Z0000 Encounter for general adult medical examination without abnormal findings: Secondary | ICD-10-CM | POA: Diagnosis not present

## 2022-01-05 DIAGNOSIS — E782 Mixed hyperlipidemia: Secondary | ICD-10-CM | POA: Diagnosis not present

## 2022-01-07 DIAGNOSIS — F4322 Adjustment disorder with anxiety: Secondary | ICD-10-CM | POA: Diagnosis not present

## 2022-01-12 DIAGNOSIS — F419 Anxiety disorder, unspecified: Secondary | ICD-10-CM | POA: Diagnosis not present

## 2022-01-19 DIAGNOSIS — F419 Anxiety disorder, unspecified: Secondary | ICD-10-CM | POA: Diagnosis not present

## 2022-01-26 DIAGNOSIS — F419 Anxiety disorder, unspecified: Secondary | ICD-10-CM | POA: Diagnosis not present

## 2022-02-17 DIAGNOSIS — F419 Anxiety disorder, unspecified: Secondary | ICD-10-CM | POA: Diagnosis not present

## 2022-03-02 DIAGNOSIS — F419 Anxiety disorder, unspecified: Secondary | ICD-10-CM | POA: Diagnosis not present

## 2022-03-18 DIAGNOSIS — F4322 Adjustment disorder with anxiety: Secondary | ICD-10-CM | POA: Diagnosis not present

## 2022-04-01 DIAGNOSIS — F4322 Adjustment disorder with anxiety: Secondary | ICD-10-CM | POA: Diagnosis not present

## 2022-04-15 DIAGNOSIS — F4322 Adjustment disorder with anxiety: Secondary | ICD-10-CM | POA: Diagnosis not present

## 2022-05-05 IMAGING — CT CT ANGIO CHEST
1 of 3 series · 12 of 32 positions shown · IV contrast (APPLIED)
Comparison: 07/28/2019

CLINICAL DATA: Short of breath, chest pain, knee surgery 1 week
ago, symptoms for 2 days

EXAM:
CT ANGIOGRAPHY CHEST WITH CONTRAST
TECHNIQUE: Multidetector CT imaging of the chest was performed using the
standard protocol during bolus administration of intravenous
contrast. Multiplanar CT image reconstructions and MIPs were
obtained to evaluate the vascular anatomy.
CONTRAST:  75mL LDQMJ0-YM5 IOPAMIDOL (LDQMJ0-YM5) INJECTION 76%

[Series 6: (id) d 1.0 b31s · axial · 0.72mm/px · z∈[-260,-29]mm · 12 of 283 slices shown]
[im 26/283  lung]
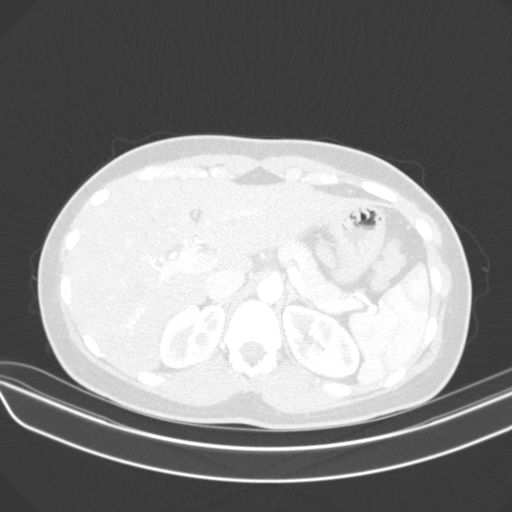
[im 52/283  mediastinal]
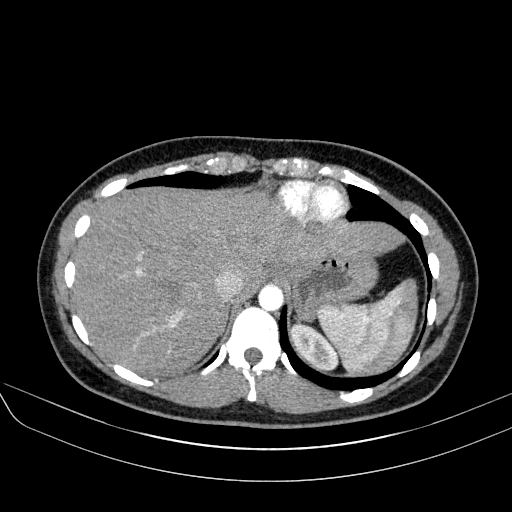
[im 77/283  lung]
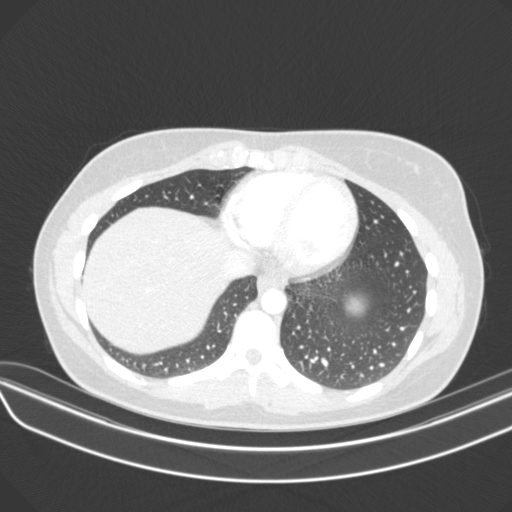
[im 103/283  mediastinal]
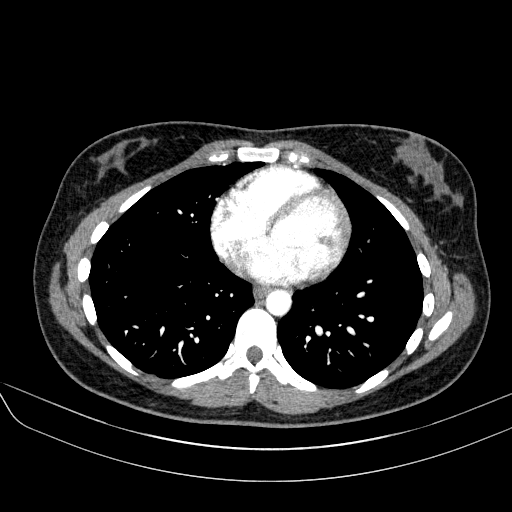
[im 129/283  lung]
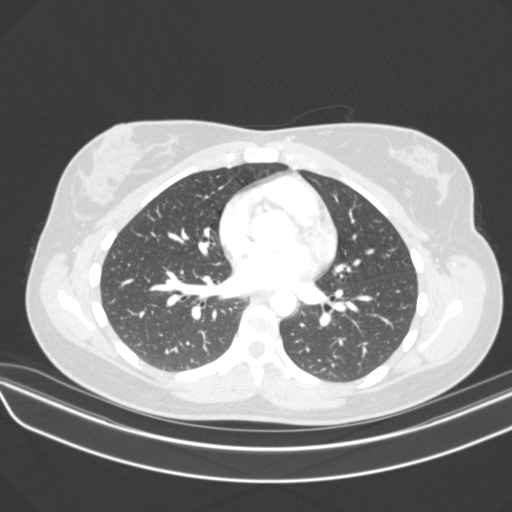
[im 131/283  mediastinal]
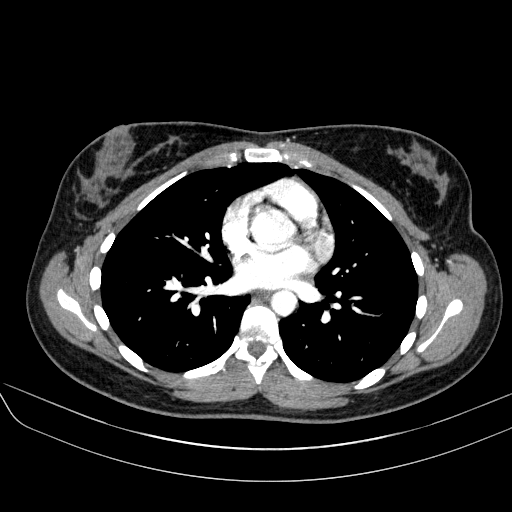
[im 142/283  lung]
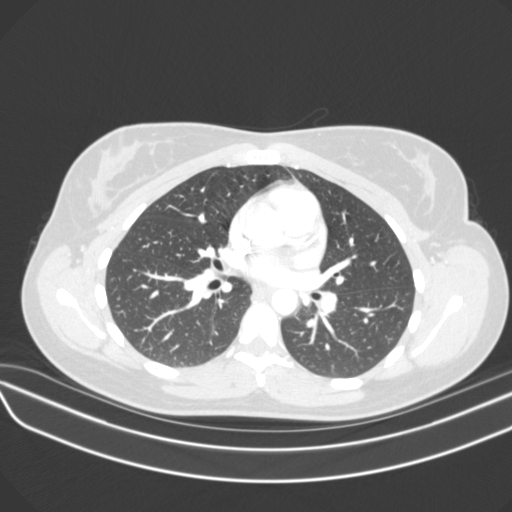
[im 154/283  mediastinal]
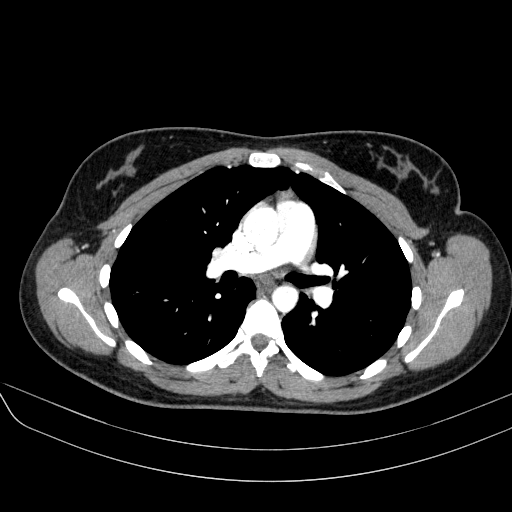
[im 180/283  lung]
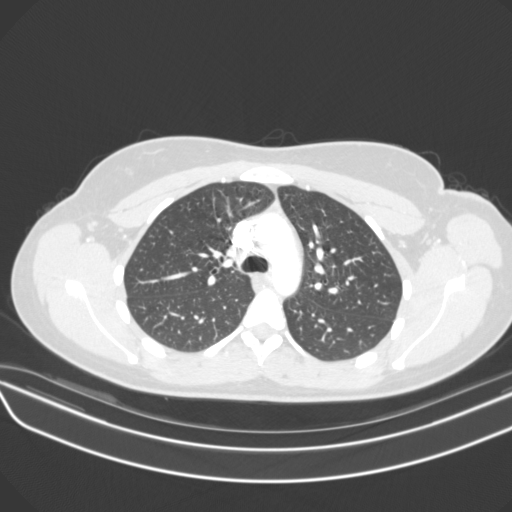
[im 206/283  mediastinal]
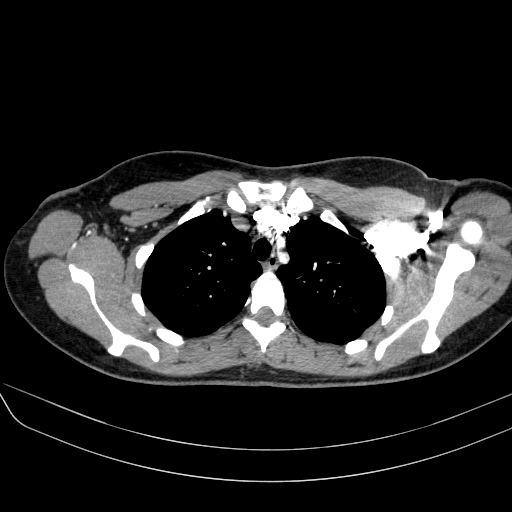
[im 231/283  lung]
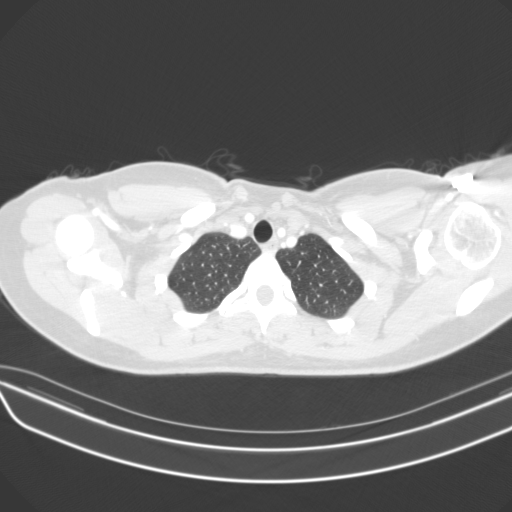
[im 257/283  mediastinal]
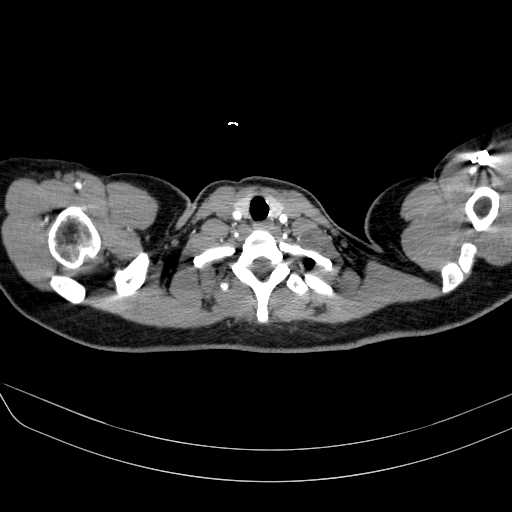

[12 of 32 positions shown; findings below may reference images not displayed]

FINDINGS: Cardiovascular: This is a technically adequate evaluation of the
pulmonary vasculature. No filling defects or pulmonary emboli.

The heart is unremarkable without pericardial effusion. No evidence
of thoracic aortic aneurysm or dissection.

Mediastinum/Nodes: No enlarged mediastinal, hilar, or axillary lymph
nodes. Thyroid gland, trachea, and esophagus demonstrate no
significant findings.

Lungs/Pleura: No acute airspace disease, effusion, or pneumothorax.
Central airways are patent.

Upper Abdomen: Hepatic steatosis. 1.6 cm hyperdense area in the
posterior right lobe liver image 75/4 likely reflects a flash fill
hemangioma.

Musculoskeletal: No acute or destructive bony lesions. Reconstructed
images demonstrate no additional findings.

Review of the MIP images confirms the above findings.
IMPRESSION: 1. No evidence of pulmonary embolus.
2. No acute intrathoracic process.

## 2022-05-05 IMAGING — US US EXTREM LOW VENOUS*L*
1 series · 14 of 24 positions shown · non-contrast
Comparison: None.

CLINICAL DATA: Acute left lower extremity pain and swelling.

EXAM:
Left LOWER EXTREMITY VENOUS DOPPLER ULTRASOUND
TECHNIQUE: Gray-scale sonography with compression, as well as color and duplex
ultrasound, were performed to evaluate the deep venous system(s)
from the level of the common femoral vein through the popliteal and
proximal calf veins.

[Series 1: us extrem low venous*left* · 0.07mm/px · 14 of 46 slices shown]
[im 1/46]
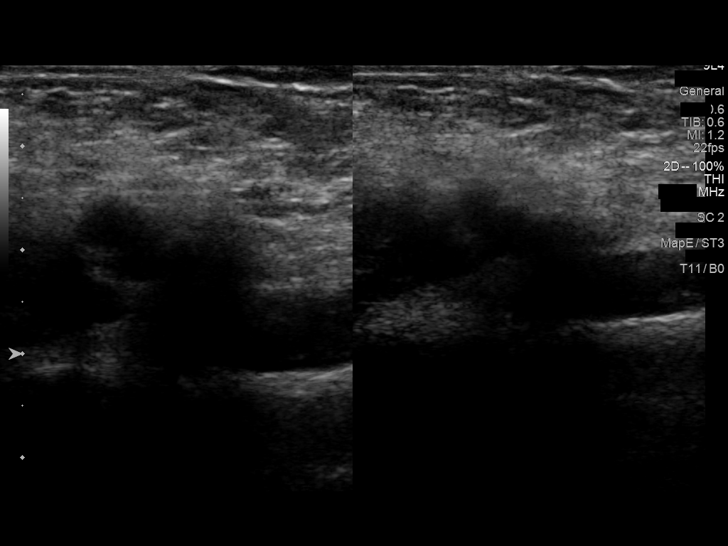
[im 4/46]
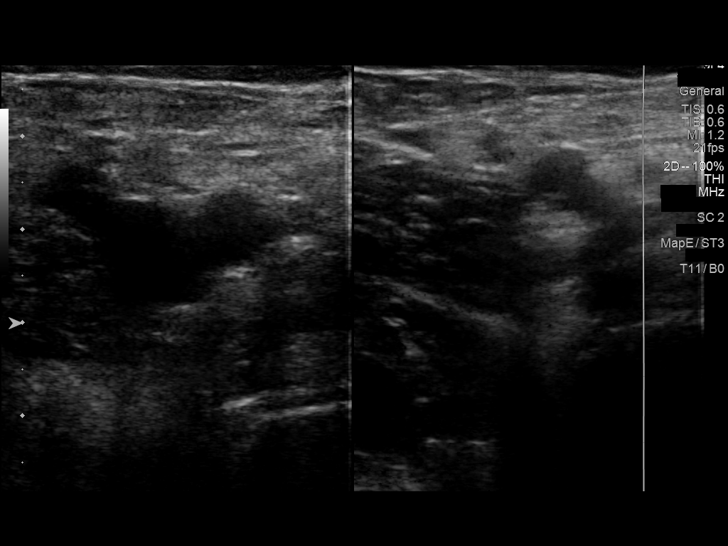
[im 8/46]
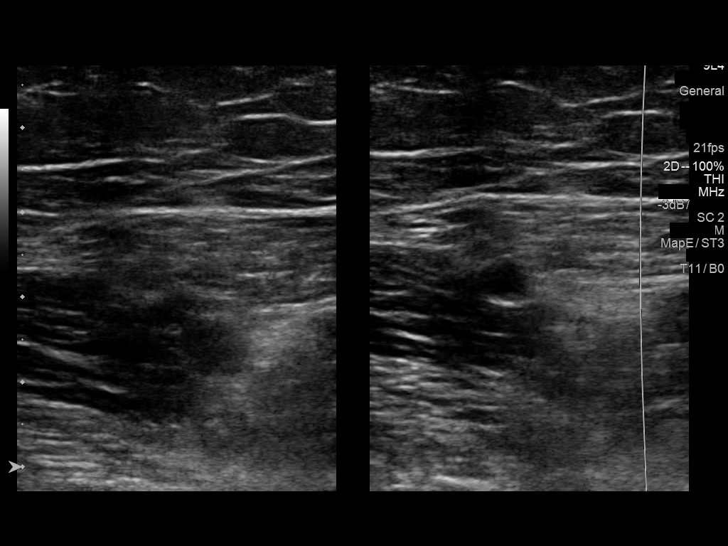
[im 12/46]
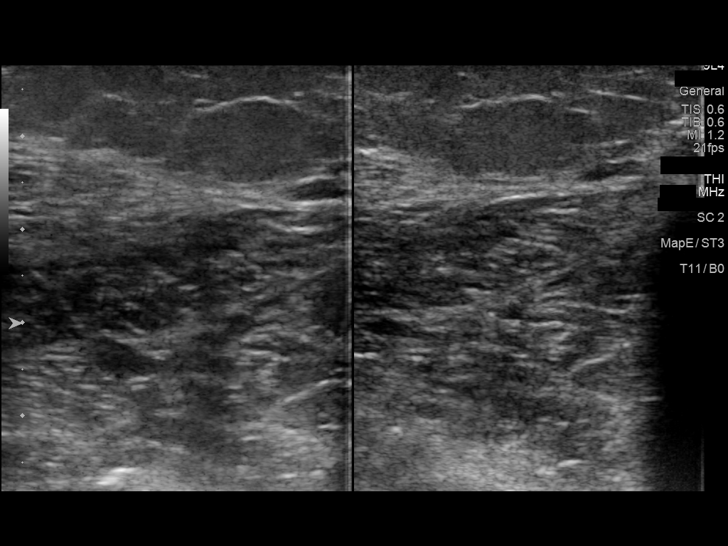
[im 14/46]
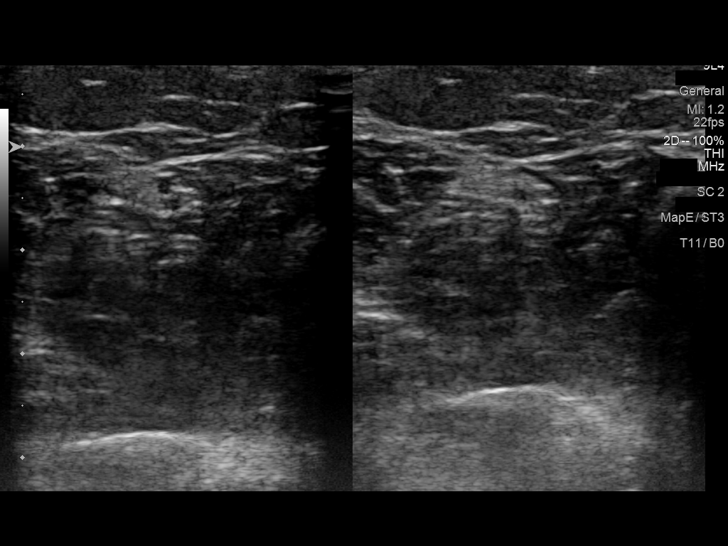
[im 18/46]
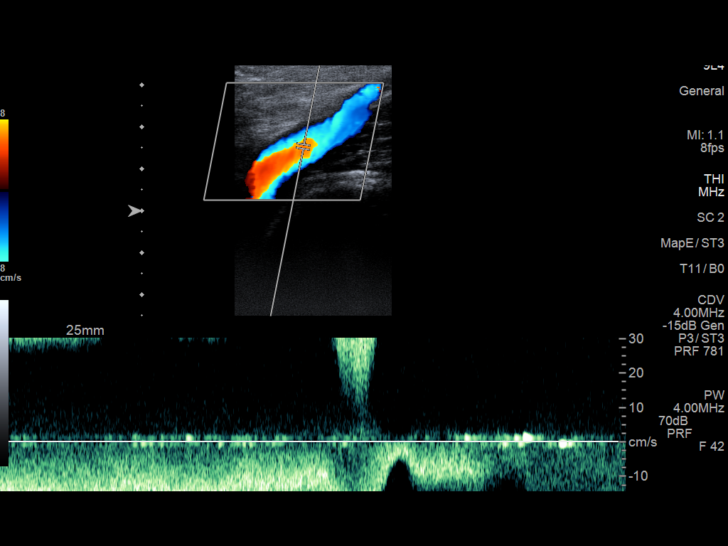
[im 22/46]
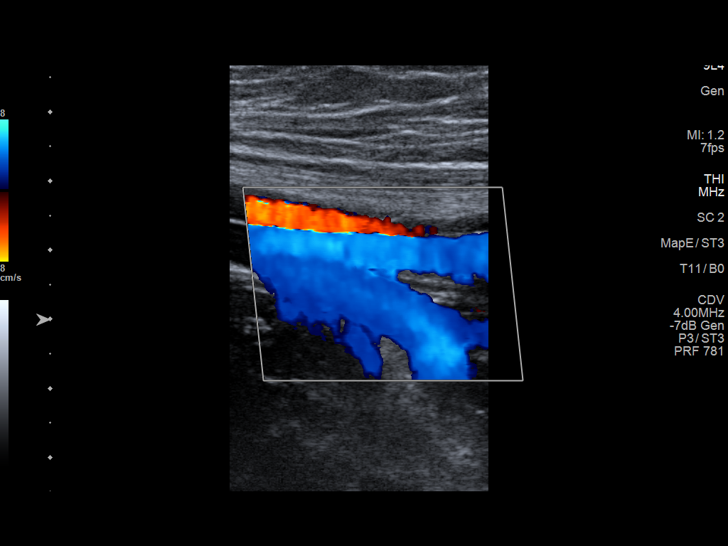
[im 24/46]
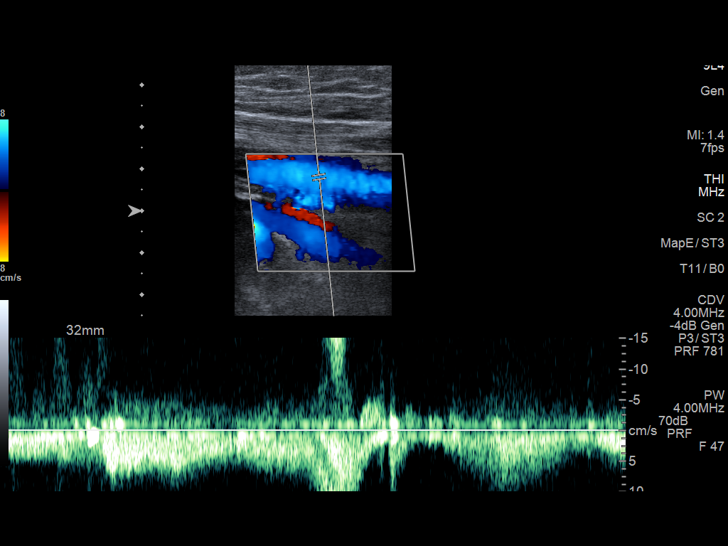
[im 28/46]
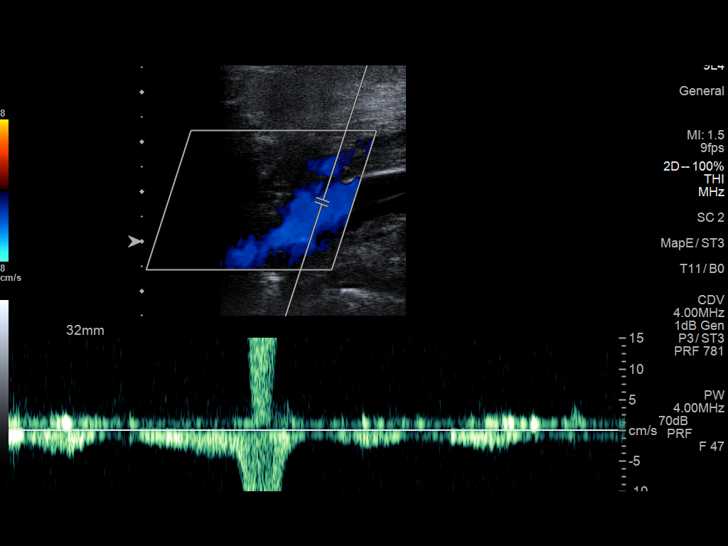
[im 32/46]
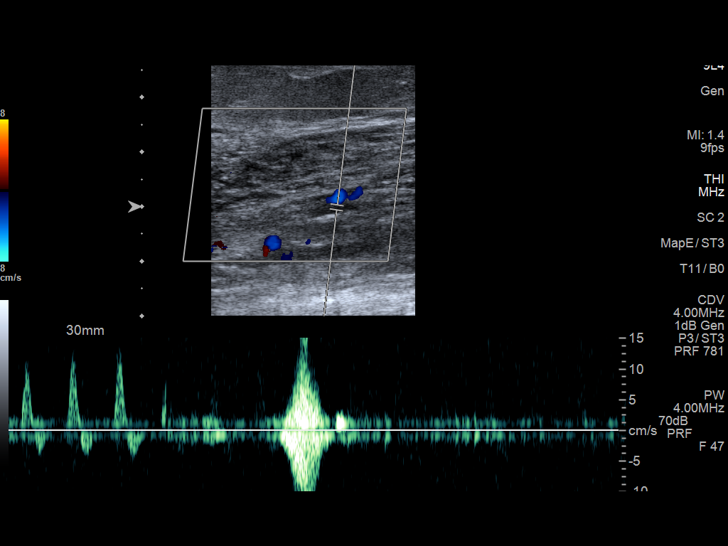
[im 36/46]
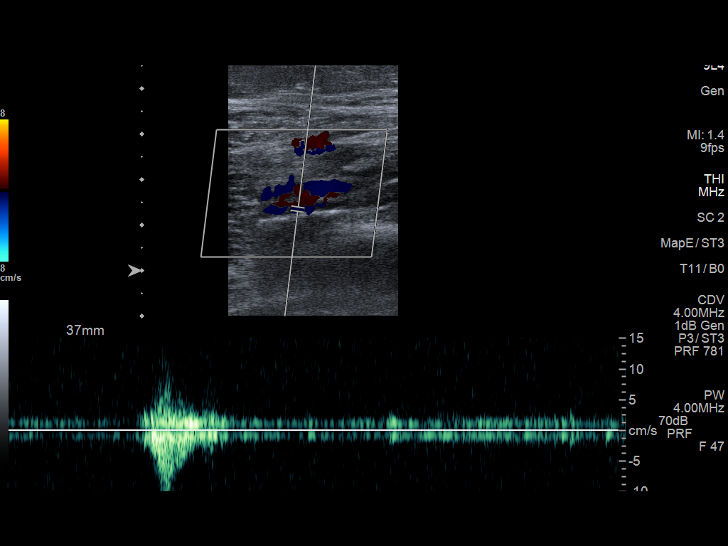
[im 38/46]
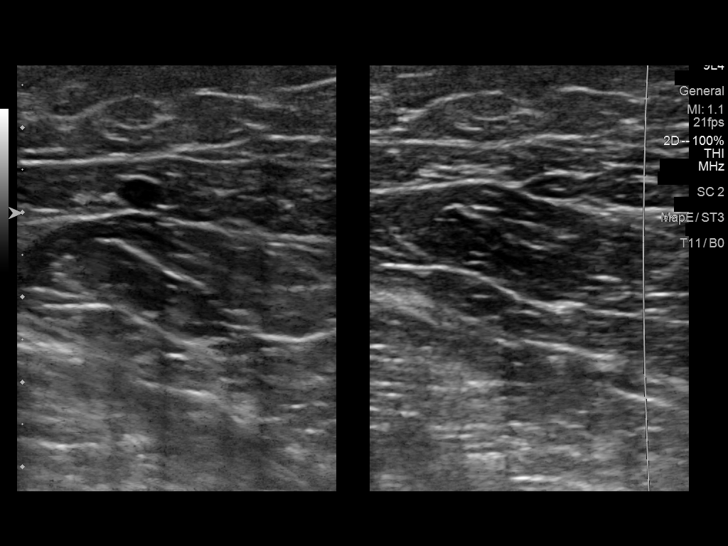
[im 42/46]
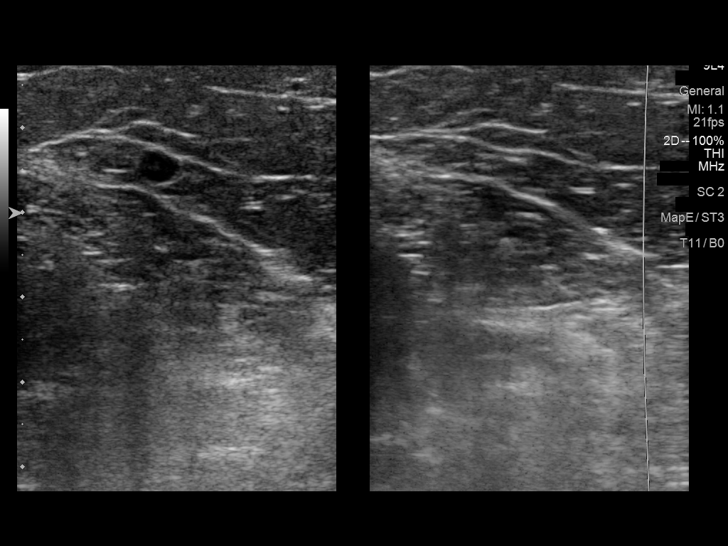
[im 46/46]
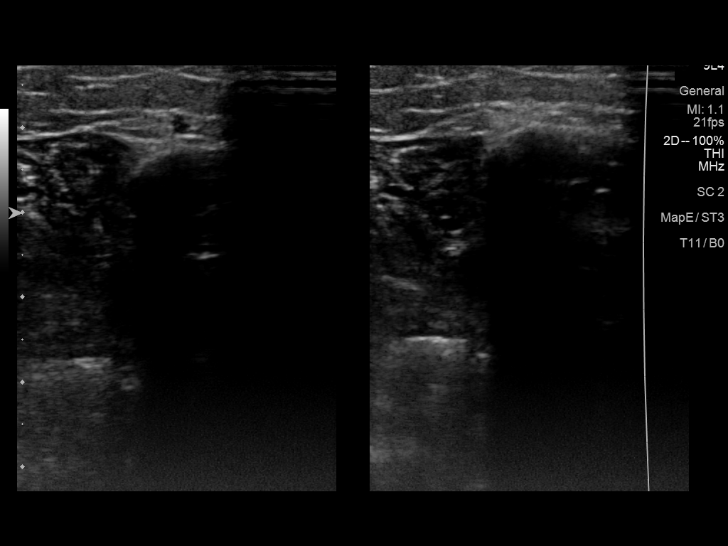

[14 of 24 positions shown; findings below may reference images not displayed]

FINDINGS: VENOUS

Normal compressibility of the common femoral, superficial femoral,
and popliteal veins, as well as the visualized calf veins.
Visualized portions of profunda femoral vein and great saphenous
vein unremarkable. No filling defects to suggest DVT on grayscale or
color Doppler imaging. Doppler waveforms show normal direction of
venous flow, normal respiratory plasticity and response to
augmentation.

Limited views of the contralateral common femoral vein are
unremarkable.

OTHER

None.

Limitations: none
IMPRESSION: Negative.

## 2022-05-06 DIAGNOSIS — F4322 Adjustment disorder with anxiety: Secondary | ICD-10-CM | POA: Diagnosis not present

## 2022-06-02 DIAGNOSIS — H5213 Myopia, bilateral: Secondary | ICD-10-CM | POA: Diagnosis not present

## 2022-06-02 DIAGNOSIS — D3131 Benign neoplasm of right choroid: Secondary | ICD-10-CM | POA: Diagnosis not present

## 2022-06-02 DIAGNOSIS — Z135 Encounter for screening for eye and ear disorders: Secondary | ICD-10-CM | POA: Diagnosis not present

## 2022-06-17 DIAGNOSIS — F4322 Adjustment disorder with anxiety: Secondary | ICD-10-CM | POA: Diagnosis not present

## 2022-09-17 DIAGNOSIS — F4322 Adjustment disorder with anxiety: Secondary | ICD-10-CM | POA: Diagnosis not present

## 2022-10-06 DIAGNOSIS — L309 Dermatitis, unspecified: Secondary | ICD-10-CM | POA: Diagnosis not present

## 2022-11-03 DIAGNOSIS — F4322 Adjustment disorder with anxiety: Secondary | ICD-10-CM | POA: Diagnosis not present

## 2022-12-08 DIAGNOSIS — F4322 Adjustment disorder with anxiety: Secondary | ICD-10-CM | POA: Diagnosis not present

## 2023-01-04 DIAGNOSIS — D225 Melanocytic nevi of trunk: Secondary | ICD-10-CM | POA: Diagnosis not present

## 2023-01-04 DIAGNOSIS — D2262 Melanocytic nevi of left upper limb, including shoulder: Secondary | ICD-10-CM | POA: Diagnosis not present

## 2023-01-04 DIAGNOSIS — D2261 Melanocytic nevi of right upper limb, including shoulder: Secondary | ICD-10-CM | POA: Diagnosis not present

## 2023-01-04 DIAGNOSIS — L700 Acne vulgaris: Secondary | ICD-10-CM | POA: Diagnosis not present

## 2023-03-10 DIAGNOSIS — E559 Vitamin D deficiency, unspecified: Secondary | ICD-10-CM | POA: Diagnosis not present

## 2023-03-10 DIAGNOSIS — Z23 Encounter for immunization: Secondary | ICD-10-CM | POA: Diagnosis not present

## 2023-03-10 DIAGNOSIS — E782 Mixed hyperlipidemia: Secondary | ICD-10-CM | POA: Diagnosis not present

## 2023-03-10 DIAGNOSIS — Z0000 Encounter for general adult medical examination without abnormal findings: Secondary | ICD-10-CM | POA: Diagnosis not present

## 2023-07-21 DIAGNOSIS — H5213 Myopia, bilateral: Secondary | ICD-10-CM | POA: Diagnosis not present

## 2023-07-21 DIAGNOSIS — D3131 Benign neoplasm of right choroid: Secondary | ICD-10-CM | POA: Diagnosis not present

## 2023-07-21 DIAGNOSIS — Z135 Encounter for screening for eye and ear disorders: Secondary | ICD-10-CM | POA: Diagnosis not present

## 2023-11-02 DIAGNOSIS — R319 Hematuria, unspecified: Secondary | ICD-10-CM | POA: Diagnosis not present

## 2023-11-02 DIAGNOSIS — N390 Urinary tract infection, site not specified: Secondary | ICD-10-CM | POA: Diagnosis not present

## 2024-03-14 DIAGNOSIS — G43009 Migraine without aura, not intractable, without status migrainosus: Secondary | ICD-10-CM | POA: Diagnosis not present

## 2024-03-14 DIAGNOSIS — Z0000 Encounter for general adult medical examination without abnormal findings: Secondary | ICD-10-CM | POA: Diagnosis not present

## 2024-03-14 DIAGNOSIS — Z124 Encounter for screening for malignant neoplasm of cervix: Secondary | ICD-10-CM | POA: Diagnosis not present

## 2024-03-14 DIAGNOSIS — E663 Overweight: Secondary | ICD-10-CM | POA: Diagnosis not present

## 2024-03-14 DIAGNOSIS — R946 Abnormal results of thyroid function studies: Secondary | ICD-10-CM | POA: Diagnosis not present

## 2024-03-14 DIAGNOSIS — F5101 Primary insomnia: Secondary | ICD-10-CM | POA: Diagnosis not present

## 2024-03-14 DIAGNOSIS — E559 Vitamin D deficiency, unspecified: Secondary | ICD-10-CM | POA: Diagnosis not present

## 2024-03-14 DIAGNOSIS — Z23 Encounter for immunization: Secondary | ICD-10-CM | POA: Diagnosis not present

## 2024-03-14 DIAGNOSIS — N926 Irregular menstruation, unspecified: Secondary | ICD-10-CM | POA: Diagnosis not present

## 2024-03-28 DIAGNOSIS — E0590 Thyrotoxicosis, unspecified without thyrotoxic crisis or storm: Secondary | ICD-10-CM | POA: Diagnosis not present

## 2024-04-26 ENCOUNTER — Ambulatory Visit: Admitting: Obstetrics and Gynecology

## 2024-04-26 ENCOUNTER — Encounter: Payer: Self-pay | Admitting: Obstetrics and Gynecology

## 2024-04-26 VITALS — BP 122/87 | HR 90 | Ht 63.0 in | Wt 157.0 lb

## 2024-04-26 DIAGNOSIS — Z3189 Encounter for other procreative management: Secondary | ICD-10-CM | POA: Diagnosis not present

## 2024-04-26 NOTE — Progress Notes (Signed)
 "  GYNECOLOGY OFFICE NOTE  History:  29 y.o. G0P0000 here today for discussion of pregnancy care. Not currently trying to get pregnant but will likely start trying in the summer/fall. Recently off POPs. 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 Taking prenatal vitamins. Having irregular periods but only stopped POPs a month ago.   Past Medical History:  Diagnosis Date   Asthma    sports induced   Complication of anesthesia    post ACL surgery had SOB, CP, dizzienee, nausea, vertigo--was negative for DVT    Past Surgical History:  Procedure Laterality Date   ANTERIOR CRUCIATE LIGAMENT REPAIR Left 2022   surgical center [place]   EXAM UNDER ANESTHESIA WITH MANIPULATION OF KNEE Left 05/19/2021   Procedure: EXAM UNDER ANESTHESIA WITH MANIPULATION OF KNEE;  Surgeon: Jane Charleston, MD;  Location: [facility];  Service: Orthopedics;  Laterality: Left;   KNEE ARTHROSCOPY Left 05/19/2021   Procedure: ARTHROSCOPY KNEE;  Surgeon: Jane Charleston, MD;  Location: [facility];  Service: Orthopedics;  Laterality: Left;   LYSIS OF ADHESION Left 05/19/2021   Procedure: LYSIS OF ADHESION;  Surgeon: Jane Charleston, MD;  Location: [facility];  Service: Orthopedics;  Laterality: Left;    Current Medications[1]  The following portions of the patient's history were reviewed and updated as appropriate: allergies, current medications, past family history, past medical history, past social history, past surgical history and problem list.   Review of Systems:  Pertinent items noted in HPI and remainder of comprehensive ROS otherwise negative.   Objective:  Physical Exam BP 122/87   Pulse 90   Ht 5' 3 (1.6 m)   Wt 157 lb (71.2 kg)   LMP 04/08/2024   BMI 27.81 kg/m  CONSTITUTIONAL: Well-developed, well-nourished female in no acute distress.  HENT:  Normocephalic, atraumatic. External right and left  ear normal. Oropharynx is clear and moist EYES: Conjunctivae and EOM are normal. Pupils are equal, round, and reactive to light. No scleral icterus.  NECK: Normal range of motion, supple, no masses SKIN: Skin is warm and dry. No rash noted. Not diaphoretic. No erythema. No pallor. NEUROLOGIC: Alert and oriented to person, place, and time. Normal reflexes, muscle tone coordination. No cranial nerve deficit noted. PSYCHIATRIC: Normal mood and affect. Normal behavior. Normal judgment and thought content. CARDIOVASCULAR: Normal heart rate noted RESPIRATORY: Effort normal, no problems with respiration noted ABDOMEN: deferred PELVIC: deferred MUSCULOSKELETAL: Normal range of motion.   Labs and Imaging No results found.  Assessment & Plan:  1. Encounter for fertility planning (Primary) -reviewed [facility], office, personnel, delivery planning for [facility] - reviewed we offer water births -reviewed pregnancy planning, optimizing pregnancy with regular exercise, healthy diet, taking PNV -reviewed how to optimize chances of achieving pregnancy easy month with regular intercourse or ovulation test strips - answered all questions - pt to call to set up annual vs establish care when she achieves pregnancy   Routine preventative health maintenance measures emphasized. Please refer to After Visit Summary for other counseling recommendations.   Return in about 6 months (around 10/24/2024) for prn.   K. Yolanda Moats, MD, [facility] Attending [facility] for [facility])       [1]  Current Outpatient Medications:    albuterol (PROVENTIL HFA;VENTOLIN HFA) 108 (90 BASE) MCG/ACT inhaler, Inhale 2 puffs into the lungs every 6 (six) hours as needed for wheezing or shortness of breath., Disp: , Rfl:  Multiple Vitamin (MULTIVITAMIN ADULT PO), Take by mouth., Disp: , Rfl:    Omega-3 Fatty Acids (FISH OIL PO), Take by mouth daily., Disp: , Rfl:    psyllium (METAMUCIL) 58.6 % powder, Take 1  packet by mouth 3 (three) times daily., Disp: , Rfl:    VITAMIN D PO, Take by mouth daily., Disp: , Rfl:    HYDROcodone -acetaminophen  (NORCO) 5-325 MG tablet, Take 1 tablet by mouth every 6 (six) hours as needed for moderate pain. MAXIMUM TOTAL ACETAMINOPHEN  DOSE IS 4000 MG PER DAY, Disp: 10 tablet, Rfl: 0   Norethin Ace-Eth Estrad-FE 1-20 MG-MCG(24) CAPS, Take 1 tablet daily (Patient not taking: Reported on 04/26/2024), Disp: , Rfl:   "
